# Patient Record
Sex: Male | Born: 1975 | Race: Black or African American | Hispanic: No | Marital: Single | Smoking: Current every day smoker
Health system: Southern US, Community
[De-identification: ages and names within clinical notes are randomized; demographics above are authoritative.]

## PROBLEM LIST (undated history)

## (undated) DIAGNOSIS — Z720 Tobacco use: Secondary | ICD-10-CM

## (undated) DIAGNOSIS — E669 Obesity, unspecified: Secondary | ICD-10-CM

## (undated) DIAGNOSIS — L309 Dermatitis, unspecified: Secondary | ICD-10-CM

## (undated) DIAGNOSIS — J309 Allergic rhinitis, unspecified: Secondary | ICD-10-CM

## (undated) DIAGNOSIS — I10 Essential (primary) hypertension: Secondary | ICD-10-CM

## (undated) DIAGNOSIS — E7800 Pure hypercholesterolemia, unspecified: Secondary | ICD-10-CM

---

## 2010-06-02 ENCOUNTER — Emergency Department (HOSPITAL_COMMUNITY): Admission: EM | Admit: 2010-06-02 | Discharge: 2010-06-02 | Payer: Self-pay | Admitting: Family Medicine

## 2010-06-22 ENCOUNTER — Emergency Department (HOSPITAL_COMMUNITY): Admission: EM | Admit: 2010-06-22 | Discharge: 2010-06-22 | Payer: Self-pay | Admitting: Emergency Medicine

## 2010-07-20 ENCOUNTER — Encounter: Admission: RE | Admit: 2010-07-20 | Discharge: 2010-09-17 | Payer: Self-pay | Admitting: General Practice

## 2010-08-12 ENCOUNTER — Emergency Department (HOSPITAL_COMMUNITY): Admission: EM | Admit: 2010-08-12 | Discharge: 2010-08-13 | Payer: Self-pay | Admitting: Emergency Medicine

## 2011-02-18 ENCOUNTER — Inpatient Hospital Stay (INDEPENDENT_AMBULATORY_CARE_PROVIDER_SITE_OTHER)
Admission: RE | Admit: 2011-02-18 | Discharge: 2011-02-18 | Disposition: A | Discharge status: Home / Self Care | Payer: Self-pay | Source: Ambulatory Visit | Attending: Family Medicine | Admitting: Family Medicine

## 2011-02-18 DIAGNOSIS — M5430 Sciatica, unspecified side: Secondary | ICD-10-CM

## 2011-03-05 LAB — DIFFERENTIAL
Basophils Absolute: 0.1 10*3/uL (ref 0.0–0.1)
Basophils Relative: 1 % (ref 0–1)
Lymphs Abs: 3.1 10*3/uL (ref 0.7–4.0)
Monocytes Relative: 8 % (ref 3–12)
Neutro Abs: 2.5 10*3/uL (ref 1.7–7.7)
Neutrophils Relative %: 39 % — ABNORMAL LOW (ref 43–77)

## 2011-03-05 LAB — BASIC METABOLIC PANEL
CO2: 29 mEq/L (ref 19–32)
Chloride: 105 mEq/L (ref 96–112)
GFR calc Af Amer: >60 mL/min (ref >60)
GFR calc non Af Amer: >60 mL/min (ref >60)
Glucose, Bld: 101 mg/dL — ABNORMAL HIGH (ref 70–99)
Potassium: 3.3 mEq/L — ABNORMAL LOW (ref 3.5–5.1)

## 2011-03-05 LAB — CBC
Hemoglobin: 14 g/dL (ref 13.0–17.0)
MCH: 26.9 pg (ref 26.0–34.0)
MCHC: 33.2 g/dL (ref 30.0–36.0)
RDW: 13 % (ref 11.5–15.5)
WBC: 6.6 10*3/uL (ref 4.0–10.5)

## 2011-03-05 LAB — POCT CARDIAC MARKERS: CKMB, poc: 1.1 ng/mL (ref 1.0–8.0)

## 2011-03-07 LAB — DIFFERENTIAL
Basophils Absolute: 0 10*3/uL (ref 0.0–0.1)
Eosinophils Absolute: 0.2 10*3/uL (ref 0.0–0.7)
Eosinophils Relative: 4 % (ref 0–5)
Lymphs Abs: 2.9 10*3/uL (ref 0.7–4.0)
Monocytes Relative: 10 % (ref 3–12)
Neutrophils Relative %: 34 % — ABNORMAL LOW (ref 43–77)

## 2011-03-07 LAB — CBC
Hemoglobin: 14.3 g/dL (ref 13.0–17.0)
MCH: 27.6 pg (ref 26.0–34.0)
MCV: 79.6 fL (ref 78.0–100.0)
Platelets: 215 10*3/uL (ref 150–400)
WBC: 5.5 10*3/uL (ref 4.0–10.5)

## 2011-03-07 LAB — URINALYSIS, ROUTINE W REFLEX MICROSCOPIC
Bilirubin Urine: NEGATIVE
Glucose, UA: >1000 mg/dL — AB
Leukocytes, UA: NEGATIVE
Urobilinogen, UA: 0.2 mg/dL (ref 0.0–1.0)

## 2011-03-07 LAB — COMPREHENSIVE METABOLIC PANEL
AST: 23 U/L (ref 0–37)
Albumin: 4 g/dL (ref 3.5–5.2)
Alkaline Phosphatase: 79 U/L (ref 39–117)
Creatinine, Ser: 1.03 mg/dL (ref 0.4–1.5)
GFR calc Af Amer: >60 mL/min (ref >60)
Glucose, Bld: 414 mg/dL — ABNORMAL HIGH (ref 70–99)
Total Bilirubin: 1.2 mg/dL (ref 0.3–1.2)

## 2011-03-07 LAB — HEMOGLOBIN A1C: Hgb A1c MFr Bld: 15 % — ABNORMAL HIGH (ref <5.7)

## 2011-03-07 LAB — GLUCOSE, CAPILLARY
Glucose-Capillary: 316 mg/dL — ABNORMAL HIGH (ref 70–99)
Glucose-Capillary: 428 mg/dL — ABNORMAL HIGH (ref 70–99)

## 2011-03-07 LAB — KETONES, QUALITATIVE

## 2011-03-07 LAB — URINE MICROSCOPIC-ADD ON

## 2011-08-26 ENCOUNTER — Emergency Department (HOSPITAL_COMMUNITY)
Admission: EM | Admit: 2011-08-26 | Discharge: 2011-08-26 | Disposition: A | Discharge status: Home / Self Care | Payer: No Typology Code available for payment source | Attending: Emergency Medicine | Admitting: Emergency Medicine

## 2011-08-26 DIAGNOSIS — IMO0002 Reserved for concepts with insufficient information to code with codable children: Secondary | ICD-10-CM | POA: Insufficient documentation

## 2011-08-26 DIAGNOSIS — X503XXA Overexertion from repetitive movements, initial encounter: Secondary | ICD-10-CM | POA: Insufficient documentation

## 2011-08-26 DIAGNOSIS — Z79899 Other long term (current) drug therapy: Secondary | ICD-10-CM | POA: Insufficient documentation

## 2011-08-26 DIAGNOSIS — J45909 Unspecified asthma, uncomplicated: Secondary | ICD-10-CM | POA: Insufficient documentation

## 2011-08-26 DIAGNOSIS — M549 Dorsalgia, unspecified: Secondary | ICD-10-CM | POA: Insufficient documentation

## 2011-08-26 DIAGNOSIS — I10 Essential (primary) hypertension: Secondary | ICD-10-CM | POA: Insufficient documentation

## 2011-08-26 DIAGNOSIS — E119 Type 2 diabetes mellitus without complications: Secondary | ICD-10-CM | POA: Insufficient documentation

## 2011-10-04 ENCOUNTER — Inpatient Hospital Stay (INDEPENDENT_AMBULATORY_CARE_PROVIDER_SITE_OTHER)
Admission: RE | Admit: 2011-10-04 | Discharge: 2011-10-04 | Disposition: A | Discharge status: Home / Self Care | Payer: No Typology Code available for payment source | Source: Ambulatory Visit | Attending: Family Medicine | Admitting: Family Medicine

## 2011-10-04 DIAGNOSIS — J069 Acute upper respiratory infection, unspecified: Secondary | ICD-10-CM

## 2011-10-04 DIAGNOSIS — J45909 Unspecified asthma, uncomplicated: Secondary | ICD-10-CM

## 2012-04-13 ENCOUNTER — Emergency Department (HOSPITAL_COMMUNITY)
Admission: EM | Admit: 2012-04-13 | Discharge: 2012-04-13 | Disposition: A | Discharge status: Home / Self Care | Payer: No Typology Code available for payment source | Attending: Emergency Medicine | Admitting: Emergency Medicine

## 2012-04-13 ENCOUNTER — Emergency Department (HOSPITAL_COMMUNITY): Payer: No Typology Code available for payment source

## 2012-04-13 ENCOUNTER — Encounter (HOSPITAL_COMMUNITY): Payer: Self-pay | Admitting: *Deleted

## 2012-04-13 DIAGNOSIS — E7800 Pure hypercholesterolemia, unspecified: Secondary | ICD-10-CM | POA: Insufficient documentation

## 2012-04-13 DIAGNOSIS — R079 Chest pain, unspecified: Secondary | ICD-10-CM | POA: Insufficient documentation

## 2012-04-13 DIAGNOSIS — J45909 Unspecified asthma, uncomplicated: Secondary | ICD-10-CM | POA: Insufficient documentation

## 2012-04-13 DIAGNOSIS — Z79899 Other long term (current) drug therapy: Secondary | ICD-10-CM | POA: Insufficient documentation

## 2012-04-13 DIAGNOSIS — J40 Bronchitis, not specified as acute or chronic: Secondary | ICD-10-CM | POA: Insufficient documentation

## 2012-04-13 DIAGNOSIS — I10 Essential (primary) hypertension: Secondary | ICD-10-CM | POA: Insufficient documentation

## 2012-04-13 DIAGNOSIS — E119 Type 2 diabetes mellitus without complications: Secondary | ICD-10-CM | POA: Insufficient documentation

## 2012-04-13 HISTORY — DX: Essential (primary) hypertension: I10

## 2012-04-13 HISTORY — DX: Pure hypercholesterolemia, unspecified: E78.00

## 2012-04-13 LAB — TROPONIN I
Troponin I: <0.3 ng/mL (ref <0.30)
Troponin I: <0.3 ng/mL (ref <0.30)

## 2012-04-13 LAB — BASIC METABOLIC PANEL
CO2: 26 mEq/L (ref 19–32)
Chloride: 102 mEq/L (ref 96–112)
Glucose, Bld: 174 mg/dL — ABNORMAL HIGH (ref 70–99)
Potassium: 3.2 mEq/L — ABNORMAL LOW (ref 3.5–5.1)
Sodium: 139 mEq/L (ref 135–145)

## 2012-04-13 LAB — CBC
MCV: 78.7 fL (ref 78.0–100.0)
Platelets: 262 10*3/uL (ref 150–400)
RBC: 5.58 MIL/uL (ref 4.22–5.81)
WBC: 4.8 10*3/uL (ref 4.0–10.5)

## 2012-04-13 LAB — URINALYSIS, ROUTINE W REFLEX MICROSCOPIC
Glucose, UA: 100 mg/dL — AB
Hgb urine dipstick: NEGATIVE
Leukocytes, UA: NEGATIVE
Specific Gravity, Urine: 1.026 (ref 1.005–1.030)
Urobilinogen, UA: 0.2 mg/dL (ref 0.0–1.0)

## 2012-04-13 LAB — DIFFERENTIAL
Lymphocytes Relative: 43 % (ref 12–46)
Lymphs Abs: 2.1 10*3/uL (ref 0.7–4.0)
Neutro Abs: 2.1 10*3/uL (ref 1.7–7.7)
Neutrophils Relative %: 44 % (ref 43–77)

## 2012-04-13 MED ORDER — GI COCKTAIL ~~LOC~~
30.0000 mL | Freq: Once | ORAL | Status: AC
  Administered 2012-04-13: 30 mL via ORAL
  Filled 2012-04-13: qty 30

## 2012-04-13 MED ORDER — ALBUTEROL SULFATE (5 MG/ML) 0.5% IN NEBU
5.0000 mg | INHALATION_SOLUTION | Freq: Once | RESPIRATORY_TRACT | Status: AC
  Administered 2012-04-13: 5 mg via RESPIRATORY_TRACT
  Filled 2012-04-13: qty 1

## 2012-04-13 MED ORDER — ASPIRIN 81 MG PO CHEW
324.0000 mg | CHEWABLE_TABLET | Freq: Once | ORAL | Status: AC
  Administered 2012-04-13: 324 mg via ORAL
  Filled 2012-04-13: qty 4

## 2012-04-13 MED ORDER — POTASSIUM CHLORIDE CRYS ER 20 MEQ PO TBCR
20.0000 meq | EXTENDED_RELEASE_TABLET | Freq: Once | ORAL | Status: AC
  Administered 2012-04-13: 20 meq via ORAL
  Filled 2012-04-13: qty 1

## 2012-04-13 MED ORDER — ALBUTEROL SULFATE HFA 108 (90 BASE) MCG/ACT IN AERS
2.0000 | INHALATION_SPRAY | RESPIRATORY_TRACT | Status: DC | PRN
  Filled 2012-04-13: qty 6.7

## 2012-04-13 MED ORDER — MORPHINE SULFATE 4 MG/ML IJ SOLN
4.0000 mg | Freq: Once | INTRAMUSCULAR | Status: AC
  Administered 2012-04-13: 4 mg via INTRAVENOUS
  Filled 2012-04-13: qty 1

## 2012-04-13 MED ORDER — SODIUM CHLORIDE 0.9 % IV BOLUS (SEPSIS)
1000.0000 mL | Freq: Once | INTRAVENOUS | Status: AC
  Administered 2012-04-13: 1000 mL via INTRAVENOUS

## 2012-04-13 NOTE — Discharge Instructions (Signed)
Bronchitis Bronchitis is the body's way of reacting to injury and/or infection (inflammation) of the bronchi. Bronchi are the air tubes that extend from the windpipe into the lungs. If the inflammation becomes severe, it may cause shortness of breath. CAUSES  Inflammation may be caused by:  A virus.   Germs (bacteria).   Dust.   Allergens.   Pollutants and many other irritants.  The cells lining the bronchial tree are covered with tiny hairs (cilia). These constantly beat upward, away from the lungs, toward the mouth. This keeps the lungs free of pollutants. When these cells become too irritated and are unable to do their job, mucus begins to develop. This causes the characteristic cough of bronchitis. The cough clears the lungs when the cilia are unable to do their job. Without either of these protective mechanisms, the mucus would settle in the lungs. Then you would develop pneumonia. Smoking is a common cause of bronchitis and can contribute to pneumonia. Stopping this habit is the single most important thing you can do to help yourself. TREATMENT   Your caregiver may prescribe an antibiotic if the cough is caused by bacteria. Also, medicines that open up your airways make it easier to breathe. Your caregiver may also recommend or prescribe an expectorant. It will loosen the mucus to be coughed up. Only take over-the-counter or prescription medicines for pain, discomfort, or fever as directed by your caregiver.   Removing whatever causes the problem (smoking, for example) is critical to preventing the problem from getting worse.   Cough suppressants may be prescribed for relief of cough symptoms.   Inhaled medicines may be prescribed to help with symptoms now and to help prevent problems from returning.   For those with recurrent (chronic) bronchitis, there may be a need for steroid medicines.  SEEK IMMEDIATE MEDICAL CARE IF:   During treatment, you develop more pus-like mucus  (purulent sputum).   You have a fever.   Your baby is older than 3 months with a rectal temperature of 102 F (38.9 C) or higher.   Your baby is 3 months old or younger with a rectal temperature of 100.4 F (38 C) or higher.   You become progressively more ill.   You have increased difficulty breathing, wheezing, or shortness of breath.  It is necessary to seek immediate medical care if you are elderly or sick from any other disease. MAKE SURE YOU:   Understand these instructions.   Will watch your condition.   Will get help right away if you are not doing well or get worse.  Document Released: 12/06/2005 Document Revised: 11/25/2011 Document Reviewed: 10/15/2008 Exit[employer] Patient Information 2012 [employer].Chest Pain (Nonspecific) It is often hard to give a specific diagnosis for the cause of chest pain. There is always a chance that your pain could be related to something serious, such as a heart attack or a blood clot in the lungs. You need to follow up with your caregiver for further evaluation. CAUSES   Heartburn.   Pneumonia or bronchitis.   Anxiety or stress.   Inflammation around your heart (pericarditis) or lung (pleuritis or pleurisy).   A blood clot in the lung.   A collapsed lung (pneumothorax). It can develop suddenly on its own (spontaneous pneumothorax) or from injury (trauma) to the chest.   Shingles infection (herpes zoster virus).  The chest wall is composed of bones, muscles, and cartilage. Any of these can be the source of the pain.  The bones can   be bruised by injury.   The muscles or cartilage can be strained by coughing or overwork.   The cartilage can be affected by inflammation and become sore (costochondritis).  DIAGNOSIS  Lab tests or other studies, such as X-rays, electrocardiography, stress testing, or cardiac imaging, may be needed to find the cause of your pain.  TREATMENT   Treatment depends on what may be causing your chest pain.  Treatment may include:   Acid blockers for heartburn.   Anti-inflammatory medicine.   Pain medicine for inflammatory conditions.   Antibiotics if an infection is present.   You may be advised to change lifestyle habits. This includes stopping smoking and avoiding alcohol, caffeine, and chocolate.   You may be advised to keep your head raised (elevated) when sleeping. This reduces the chance of acid going backward from your stomach into your esophagus.   Most of the time, nonspecific chest pain will improve within 2 to 3 days with rest and mild pain medicine.  HOME CARE INSTRUCTIONS   If antibiotics were prescribed, take your antibiotics as directed. Finish them even if you start to feel better.   For the next few days, avoid physical activities that bring on chest pain. Continue physical activities as directed.   Do not smoke.   Avoid drinking alcohol.   Only take over-the-counter or prescription medicine for pain, discomfort, or fever as directed by your caregiver.   Follow your caregiver's suggestions for further testing if your chest pain does not go away.   Keep any follow-up appointments you made. If you do not go to an appointment, you could develop lasting (chronic) problems with pain. If there is any problem keeping an appointment, you must call to reschedule.  SEEK MEDICAL CARE IF:   You think you are having problems from the medicine you are taking. Read your medicine instructions carefully.   Your chest pain does not go away, even after treatment.   You develop a rash with blisters on your chest.  SEEK IMMEDIATE MEDICAL CARE IF:   You have increased chest pain or pain that spreads to your arm, neck, jaw, back, or abdomen.   You develop shortness of breath, an increasing cough, or you are coughing up blood.   You have severe back or abdominal pain, feel nauseous, or vomit.   You develop severe weakness, fainting, or chills.   You have a fever.  THIS IS AN  EMERGENCY. Do not wait to see if the pain will go away. Get medical help at once. Call your local emergency services (911 in [place] Do not drive yourself to the hospital. MAKE SURE YOU:   Understand these instructions.   Will watch your condition.   Will get help right away if you are not doing well or get worse.  Document Released: 09/15/2005 Document Revised: 11/25/2011 Document Reviewed: 07/11/2008 Exit[employer] Patient Information 2012 [employer]. 

## 2012-04-13 NOTE — ED Notes (Signed)
Pt states "I go to the [facility] in [place], but I live here, gotta find me a doctor here 'cause she just up and left, my chest started hurting yesterday, feels like something sitting on me & tightness"

## 2012-04-13 NOTE — ED Provider Notes (Signed)
History     CSN: [id number]  Arrival date & time 04/13/12  0805   First MD Initiated Contact with Patient 04/13/12 0825      Chief Complaint  Patient presents with  . Chest Pain    (Consider location/radiation/quality/duration/timing/severity/associated sxs/prior treatment) HPI Comments: Pt also states that he feels very tired this am and that he almost fell asleep while driving  Patient is a 36 y.o. male presenting with chest pain. The history is provided by the patient. No language interpreter was used.  Chest Pain The chest pain began 6 - 12 hours ago. Chest pain occurs constantly. The chest pain is unchanged. The severity of the pain is moderate. The quality of the pain is described as pressure-like. The pain does not radiate. Primary symptoms include shortness of breath. Pertinent negatives for primary symptoms include no fever, no fatigue, no syncope, no cough, no palpitations, no abdominal pain, no nausea, no vomiting and no dizziness.  Pertinent negatives for associated symptoms include no claudication, no diaphoresis, no numbness, no orthopnea, no paroxysmal nocturnal dyspnea and no weakness. He tried nothing for the symptoms.  His past medical history is significant for diabetes, hyperlipidemia and hypertension.     Past Medical History  Diagnosis Date  . Diabetes mellitus   . Hypertension   . Hypercholesteremia   . Asthma     History reviewed. No pertinent past surgical history.  No family history on file.  History  Substance Use Topics  . Smoking status: Current Everyday Smoker -- 0.0 packs/day  . Smokeless tobacco: Not on file  . Alcohol Use: No      Review of Systems  Constitutional: Negative for fever, diaphoresis, activity change, appetite change and fatigue.  HENT: Negative for congestion, sore throat, rhinorrhea, neck pain and neck stiffness.   Respiratory: Positive for shortness of breath. Negative for cough.   Cardiovascular: Positive for chest  pain. Negative for palpitations, orthopnea, claudication and syncope.  Gastrointestinal: Negative for nausea, vomiting, abdominal pain, diarrhea and constipation.  Genitourinary: Negative for dysuria, urgency, frequency and flank pain.  Musculoskeletal: Negative for myalgias, back pain and arthralgias.  Neurological: Negative for dizziness, weakness, light-headedness, numbness and headaches.  All other systems reviewed and are negative.    Allergies  Shellfish allergy and Iodine  Home Medications   Current Outpatient Rx  Name Route Sig Dispense Refill  . ALBUTEROL SULFATE HFA 108 (90 BASE) MCG/ACT IN AERS Inhalation Inhale 2 puffs into the lungs every 6 (six) hours as needed. For shortness of breath and wheezing    . GLIPIZIDE ER 5 MG PO TB24 Oral Take 5 mg by mouth daily.    Marland Kitchen HYDROCHLOROTHIAZIDE 25 MG PO TABS Oral Take 25 mg by mouth daily.    Marland Kitchen LISINOPRIL 10 MG PO TABS Oral Take 10 mg by mouth daily.    Marland Kitchen METFORMIN HCL ER 500 MG PO TB24 Oral Take 500 mg by mouth 2 (two) times daily.    Marland Kitchen OXYMETAZOLINE HCL 0.05 % NA SOLN Nasal Place 2 sprays into the nose 2 (two) times daily.    Marland Kitchen PRAVASTATIN SODIUM 20 MG PO TABS Oral Take 20 mg by mouth daily.      BP 109/56  Pulse 58  Temp(Src) 97.4 F (36.3 C) (Oral)  Resp 16  Wt 207 lb (93.895 kg)  SpO2 99%  Physical Exam  Nursing note and vitals reviewed. Constitutional: He is oriented to person, place, and time. He appears well-developed and well-nourished. No distress.  HENT:  Head: Normocephalic and atraumatic.  Mouth/Throat: Oropharynx is clear and moist. No oropharyngeal exudate.  Eyes: Conjunctivae and EOM are normal. Pupils are equal, round, and reactive to light.  Neck: Normal range of motion. Neck supple.  Cardiovascular: Normal rate, regular rhythm, normal heart sounds and intact distal pulses.  Exam reveals no gallop and no friction rub.   No murmur heard. Pulmonary/Chest: Effort normal and breath sounds normal. No  respiratory distress. He exhibits no tenderness.  Abdominal: Soft. Bowel sounds are normal. There is no tenderness. There is no rebound and no guarding.  Musculoskeletal: Normal range of motion. He exhibits no tenderness.  Neurological: He is alert and oriented to person, place, and time. No cranial nerve deficit.  Skin: Skin is warm and dry. No rash noted.    ED Course  Procedures (including critical care time)   Date: 04/13/2012  Rate: 74  Rhythm: normal sinus rhythm  QRS Axis: normal  Intervals: normal  ST/T Wave abnormalities: nonspecific T wave changes  Conduction Disutrbances:none  Narrative Interpretation:   Old EKG Reviewed: none available  Labs Reviewed  GLUCOSE, CAPILLARY - Abnormal; Notable for the following:    Glucose-Capillary 228 (*)    All other components within normal limits  DIFFERENTIAL - Abnormal; Notable for the following:    Eosinophils Relative 6 (*)    All other components within normal limits  BASIC METABOLIC PANEL - Abnormal; Notable for the following:    Potassium 3.2 (*)    Glucose, Bld 174 (*)    All other components within normal limits  URINALYSIS, ROUTINE W REFLEX MICROSCOPIC - Abnormal; Notable for the following:    Glucose, UA 100 (*)    All other components within normal limits  CBC  TROPONIN I  TROPONIN I   Dg Chest 2 View  04/13/2012  *RADIOLOGY REPORT*  Clinical Data: Shortness of breath.  Smoker.  CHEST - 2 VIEW  Comparison: PA and lateral chest 08/12/2010.  Findings: Lungs are clear. Peribronchial thickening is seen.  Heart size is normal.  No pneumothorax or effusion.  No focal bony abnormality.  IMPRESSION: No acute disease.  Bronchitic change noted.  Original Report Authenticated By: Bernadene Bell. D'ALESSIO, M.D.     1. Chest pain   2. Bronchitis       MDM  Chest pain with low concern for ACS as a cause of the symptoms. Patient's had persistent pain and had a negative delta troponin. I feel this adequately rules out ACS in this  scenario. He has good primary care followup. Blood sugars within an acceptable range. Has been taking his medications as directed. Has a component of bronchitis on chest x-ray. Received albuterol with improvement of his overall condition. He'll be discharged home with instructions to followup with his primary care physician. I do feel the patient has a component of sleep apnea. Experienced him snoring while in the room and he has been falling asleep easily. I instructed him to discuss formal sleep testing with his primary care physician        Dayton Bailiff, MD 04/13/12 1222

## 2012-11-22 ENCOUNTER — Observation Stay (HOSPITAL_COMMUNITY)
Admission: EM | Admit: 2012-11-22 | Discharge: 2012-11-23 | Disposition: A | Discharge status: Home / Self Care | Payer: No Typology Code available for payment source | Attending: Internal Medicine | Admitting: Internal Medicine

## 2012-11-22 ENCOUNTER — Encounter (HOSPITAL_COMMUNITY): Payer: Self-pay | Admitting: *Deleted

## 2012-11-22 ENCOUNTER — Emergency Department (HOSPITAL_COMMUNITY): Payer: No Typology Code available for payment source

## 2012-11-22 DIAGNOSIS — J309 Allergic rhinitis, unspecified: Secondary | ICD-10-CM

## 2012-11-22 DIAGNOSIS — E7800 Pure hypercholesterolemia, unspecified: Secondary | ICD-10-CM

## 2012-11-22 DIAGNOSIS — R51 Headache: Secondary | ICD-10-CM | POA: Insufficient documentation

## 2012-11-22 DIAGNOSIS — F17200 Nicotine dependence, unspecified, uncomplicated: Secondary | ICD-10-CM | POA: Insufficient documentation

## 2012-11-22 DIAGNOSIS — I10 Essential (primary) hypertension: Secondary | ICD-10-CM | POA: Insufficient documentation

## 2012-11-22 DIAGNOSIS — J3489 Other specified disorders of nose and nasal sinuses: Secondary | ICD-10-CM | POA: Insufficient documentation

## 2012-11-22 DIAGNOSIS — M542 Cervicalgia: Secondary | ICD-10-CM | POA: Insufficient documentation

## 2012-11-22 DIAGNOSIS — E669 Obesity, unspecified: Secondary | ICD-10-CM | POA: Insufficient documentation

## 2012-11-22 DIAGNOSIS — E876 Hypokalemia: Secondary | ICD-10-CM | POA: Insufficient documentation

## 2012-11-22 DIAGNOSIS — J45909 Unspecified asthma, uncomplicated: Secondary | ICD-10-CM | POA: Insufficient documentation

## 2012-11-22 DIAGNOSIS — E119 Type 2 diabetes mellitus without complications: Secondary | ICD-10-CM | POA: Insufficient documentation

## 2012-11-22 DIAGNOSIS — M79609 Pain in unspecified limb: Secondary | ICD-10-CM | POA: Insufficient documentation

## 2012-11-22 DIAGNOSIS — R079 Chest pain, unspecified: Secondary | ICD-10-CM

## 2012-11-22 DIAGNOSIS — I209 Angina pectoris, unspecified: Secondary | ICD-10-CM

## 2012-11-22 DIAGNOSIS — Z79899 Other long term (current) drug therapy: Secondary | ICD-10-CM | POA: Insufficient documentation

## 2012-11-22 DIAGNOSIS — Z720 Tobacco use: Secondary | ICD-10-CM | POA: Diagnosis present

## 2012-11-22 DIAGNOSIS — L309 Dermatitis, unspecified: Secondary | ICD-10-CM

## 2012-11-22 DIAGNOSIS — R0602 Shortness of breath: Secondary | ICD-10-CM | POA: Insufficient documentation

## 2012-11-22 DIAGNOSIS — R0789 Other chest pain: Principal | ICD-10-CM | POA: Insufficient documentation

## 2012-11-22 DIAGNOSIS — E1169 Type 2 diabetes mellitus with other specified complication: Secondary | ICD-10-CM

## 2012-11-22 HISTORY — DX: Tobacco use: Z72.0

## 2012-11-22 HISTORY — DX: Allergic rhinitis, unspecified: J30.9

## 2012-11-22 HISTORY — DX: Obesity, unspecified: E66.9

## 2012-11-22 HISTORY — DX: Dermatitis, unspecified: L30.9

## 2012-11-22 LAB — BASIC METABOLIC PANEL
CO2: 28 mEq/L (ref 19–32)
Calcium: 9.2 mg/dL (ref 8.4–10.5)
Creatinine, Ser: 1.03 mg/dL (ref 0.50–1.35)
GFR calc Af Amer: >90 mL/min (ref >90)
GFR calc non Af Amer: >90 mL/min (ref >90)
Sodium: 138 mEq/L (ref 135–145)

## 2012-11-22 LAB — CBC
HCT: 42.1 % (ref 39.0–52.0)
Hemoglobin: 14 g/dL (ref 13.0–17.0)
MCH: 26.2 pg (ref 26.0–34.0)
MCHC: 33.3 g/dL (ref 30.0–36.0)
RDW: 13 % (ref 11.5–15.5)

## 2012-11-22 LAB — TSH: TSH: 0.79 u[IU]/mL (ref 0.350–4.500)

## 2012-11-22 LAB — RAPID URINE DRUG SCREEN, HOSP PERFORMED: Opiates: NOT DETECTED

## 2012-11-22 LAB — CBC WITH DIFFERENTIAL/PLATELET
Basophils Absolute: 0 10*3/uL (ref 0.0–0.1)
Basophils Relative: 1 % (ref 0–1)
Eosinophils Absolute: 0.2 10*3/uL (ref 0.0–0.7)
Eosinophils Relative: 4 % (ref 0–5)
Lymphocytes Relative: 46 % (ref 12–46)
MCHC: 33.4 g/dL (ref 30.0–36.0)
MCV: 78.1 fL (ref 78.0–100.0)
Platelets: 251 10*3/uL (ref 150–400)
RDW: 12.9 % (ref 11.5–15.5)
WBC: 5.3 10*3/uL (ref 4.0–10.5)

## 2012-11-22 LAB — URINALYSIS, ROUTINE W REFLEX MICROSCOPIC
Hgb urine dipstick: NEGATIVE
Ketones, ur: NEGATIVE mg/dL
Protein, ur: NEGATIVE mg/dL
Urobilinogen, UA: 1 mg/dL (ref 0.0–1.0)

## 2012-11-22 LAB — PROTIME-INR: INR: 0.92 (ref 0.00–1.49)

## 2012-11-22 LAB — TROPONIN I
Troponin I: <0.3 ng/mL (ref <0.30)
Troponin I: <0.3 ng/mL (ref <0.30)
Troponin I: <0.3 ng/mL (ref <0.30)

## 2012-11-22 LAB — APTT: aPTT: 39 seconds — ABNORMAL HIGH (ref 24–37)

## 2012-11-22 MED ORDER — NITROGLYCERIN 0.4 MG SL SUBL
0.4000 mg | SUBLINGUAL_TABLET | SUBLINGUAL | Status: DC | PRN
  Administered 2012-11-22 (×3): 0.4 mg via SUBLINGUAL

## 2012-11-22 MED ORDER — ACETAMINOPHEN 325 MG PO TABS: 650.0000 mg | ORAL_TABLET | ORAL | Status: DC | PRN

## 2012-11-22 MED ORDER — METOPROLOL TARTRATE 12.5 MG HALF TABLET
12.5000 mg | ORAL_TABLET | Freq: Two times a day (BID) | ORAL | Status: DC
  Administered 2012-11-22 – 2012-11-23 (×2): 12.5 mg via ORAL
  Filled 2012-11-22 (×3): qty 1

## 2012-11-22 MED ORDER — HEPARIN (PORCINE) IN NACL 100-0.45 UNIT/ML-% IJ SOLN: 1200.0000 [IU]/h | INTRAMUSCULAR | Status: DC

## 2012-11-22 MED ORDER — HYDROMORPHONE HCL PF 1 MG/ML IJ SOLN
1.0000 mg | INTRAMUSCULAR | Status: AC | PRN
  Administered 2012-11-22: 1 mg via INTRAVENOUS
  Filled 2012-11-22: qty 1

## 2012-11-22 MED ORDER — ASPIRIN EC 81 MG PO TBEC
81.0000 mg | DELAYED_RELEASE_TABLET | Freq: Every day | ORAL | Status: DC
  Administered 2012-11-23: 81 mg via ORAL
  Filled 2012-11-22: qty 1

## 2012-11-22 MED ORDER — GLIPIZIDE ER 5 MG PO TB24
5.0000 mg | ORAL_TABLET | Freq: Every day | ORAL | Status: DC
  Administered 2012-11-23: 5 mg via ORAL
  Filled 2012-11-22: qty 1

## 2012-11-22 MED ORDER — ASPIRIN 81 MG PO CHEW
162.0000 mg | CHEWABLE_TABLET | Freq: Once | ORAL | Status: AC
  Administered 2012-11-22: 162 mg via ORAL
  Filled 2012-11-22: qty 2

## 2012-11-22 MED ORDER — POTASSIUM CHLORIDE CRYS ER 20 MEQ PO TBCR
40.0000 meq | EXTENDED_RELEASE_TABLET | Freq: Once | ORAL | Status: AC
  Administered 2012-11-22: 40 meq via ORAL
  Filled 2012-11-22: qty 2

## 2012-11-22 MED ORDER — ACETAMINOPHEN 325 MG PO TABS: 650.0000 mg | ORAL_TABLET | Freq: Four times a day (QID) | ORAL | Status: DC | PRN

## 2012-11-22 MED ORDER — LISINOPRIL 10 MG PO TABS
10.0000 mg | ORAL_TABLET | Freq: Every day | ORAL | Status: DC
  Administered 2012-11-22: 10 mg via ORAL
  Filled 2012-11-22 (×2): qty 1

## 2012-11-22 MED ORDER — METFORMIN HCL ER 500 MG PO TB24
500.0000 mg | ORAL_TABLET | Freq: Two times a day (BID) | ORAL | Status: DC
  Administered 2012-11-22 – 2012-11-23 (×2): 500 mg via ORAL
  Filled 2012-11-22 (×3): qty 1

## 2012-11-22 MED ORDER — ONDANSETRON HCL 4 MG/2ML IJ SOLN: 4.0000 mg | Freq: Four times a day (QID) | INTRAMUSCULAR | Status: DC | PRN

## 2012-11-22 MED ORDER — ONDANSETRON HCL 4 MG/2ML IJ SOLN: 4.0000 mg | Freq: Three times a day (TID) | INTRAMUSCULAR | Status: AC | PRN

## 2012-11-22 MED ORDER — ASPIRIN 81 MG PO CHEW
324.0000 mg | CHEWABLE_TABLET | ORAL | Status: AC
  Administered 2012-11-22: 324 mg via ORAL
  Filled 2012-11-22: qty 4

## 2012-11-22 MED ORDER — ASPIRIN 300 MG RE SUPP
300.0000 mg | RECTAL | Status: AC
  Filled 2012-11-22: qty 1

## 2012-11-22 MED ORDER — SIMVASTATIN 10 MG PO TABS
10.0000 mg | ORAL_TABLET | Freq: Every day | ORAL | Status: DC
  Administered 2012-11-22: 10 mg via ORAL
  Filled 2012-11-22 (×2): qty 1

## 2012-11-22 MED ORDER — HEPARIN BOLUS VIA INFUSION: 4000.0000 [IU] | Freq: Once | INTRAVENOUS | Status: DC

## 2012-11-22 MED ORDER — ALBUTEROL SULFATE HFA 108 (90 BASE) MCG/ACT IN AERS
2.0000 | INHALATION_SPRAY | Freq: Four times a day (QID) | RESPIRATORY_TRACT | Status: DC | PRN
  Filled 2012-11-22 (×2): qty 6.7

## 2012-11-22 MED ORDER — NITROGLYCERIN 0.4 MG SL SUBL: 0.4000 mg | SUBLINGUAL_TABLET | SUBLINGUAL | Status: DC | PRN

## 2012-11-22 MED ORDER — ENOXAPARIN SODIUM 40 MG/0.4ML ~~LOC~~ SOLN
40.0000 mg | SUBCUTANEOUS | Status: DC
  Administered 2012-11-22: 40 mg via SUBCUTANEOUS
  Filled 2012-11-22 (×2): qty 0.4

## 2012-11-22 MED ORDER — HYDROCHLOROTHIAZIDE 25 MG PO TABS
25.0000 mg | ORAL_TABLET | Freq: Every day | ORAL | Status: DC
  Administered 2012-11-23: 25 mg via ORAL
  Filled 2012-11-22: qty 1

## 2012-11-22 NOTE — Progress Notes (Signed)
ANTICOAGULATION CONSULT NOTE - Initial Consult  Pharmacy Consult for Heparin Indication: chest pain/ACS  Allergies  Allergen Reactions  . Shellfish Allergy Anaphylaxis, Hives and Swelling  . Iodine Other (See Comments)    Patient Measurements: Ht: 69in Wt: 104kg IBW: 70.7kg Heparin Dosing Weight: 84kg  Vital Signs: Temp: 98.3 F (36.8 C) (12/04 1447) Temp src: Oral (12/04 1447) BP: 137/60 mmHg (12/04 1514) Pulse Rate: 76  (12/04 1514)  Labs:  [facility] 11/22/12 1331  HGB 13.7  HCT 41.0  PLT 251  APTT --  LABPROT --  INR --  HEPARINUNFRC --  CREATININE 1.03  CKTOTAL --  CKMB --  TROPONINI <0.30  PTT: PT/INR:  CrCl ~100 ml/min/1.72m2 (normalized)  Medical History: Past Medical History  Diagnosis Date  . Diabetes mellitus   . Hypertension   . Hypercholesteremia   . Asthma     Medications:  Scheduled:    . [COMPLETED] aspirin  162 mg Oral Once   Infusions:   PRN: nitroGLYCERIN  Assessment:  36 y.o. male presenting with chest pain and shortness of breath  Begin heparin per Rx for rule out MI, initial troponin negative, EKG with borderline T wave abnormalities  CBC essentially normal, no prior issues with bleeding reported  No anticoagulant/antiplatelet use prior to admit recorded; given one-time dose of aspirin 162mg  in ER  Goal of Therapy:  Heparin level 0.3-0.7 units/ml Monitor platelets by anticoagulation protocol: Yes   Plan:   Heparin 4000 units IV bolus x1  Heparin 1200 units/hr (~14 units/kg/hr of adjusted weight)  Check heparin level in 6hrs  Daily heparin level and CBC  Loralee Pacas, PharmD, BCPS Pager: [phone] 11/22/2012,4:39 PM

## 2012-11-22 NOTE — ED Notes (Signed)
Pt states started having chest pain yesterday but got worse today, states having L sided chest pain 8/10, shortness of breath, pain radiates down left leg, pt also states sometimes he has numbness/tingling in L arm and leg. Pt states chest pain feels like pressure. Denies n/v/d. Pt states "I do have a slight headache". Pt sleeping when entered the room.

## 2012-11-22 NOTE — H&P (Signed)
[facility] History and Physical  Julies Carmickle ZOX:[id number] DOB: [birth year] DOA: 11/22/2012  Referring physician:  Derwood Kaplan PCP: No primary provider on file.   Chief Complaint: chest pain  HPI:   The patient is a 36 yo male with history of HTN (7 years), HLD (2 years), T2DM (2 years), tobacco use.  Developed 8/10 substernal to left chest tightness/pressure/pain that radiated to the left neck.  It was associated with a slight headache, no increased SOB.  Denies associated nausea, diaphoresis.  He was able to sleep and felt better.  This morning, he awoke without pain but as he became more active, and the pain returned.  He again did not have SOB, nausea, diaphoresis with the pain, but he did feel lightheaded.  He went back to sleep but pain persisted.  Pain is worse with movement of the left shoulder and arm.  In the ER, he received NTG which did not help his pain initially, but an hour after his last dose, his pain improved from an 8/10 to a 7/10.    Review of Systems:   Denies fevers, chills, weight loss, night sweats, changes to hearing or vision.  Rhinorrhea and sinus congestion - getting over a cold.  He was previously coughing up yellow sputum which is getting better.  Sore throat is also resolving.  He states he can walk miles without shortness of breath.  Denies LEE, orthopnea.  PND twice weekly, stable.  Denies polyuria, polydipsia, dysuria, blood in stools, constipation, diarrhea, lymphadenopathy, skin rashes, ulcers, slurred speech, denies focal numbness/weakness, anxiety, depression, acid reflux.  Later this morning, he developed a pain that shot down the back of his leg to his heel and then resolved after a couple of minutes.    Past Medical History  Diagnosis Date  . Diabetes mellitus   . Hypertension   . Hypercholesteremia   . Asthma   . Eczema   . Allergic rhinitis   . Tobacco use   . Obesity    History reviewed. No pertinent past surgical history. Social  History:  reports that he has been smoking Cigarettes.  He has been smoking about .5 packs per day. He has never used smokeless tobacco. He reports that he does not drink alcohol or use illicit drugs. Lives in a house with girlfriend, son, and daughter.  Uses no assist device.  He works as a Financial risk analyst.    Allergies  Allergen Reactions  . Shellfish Allergy Anaphylaxis, Hives and Swelling  . Iodine Other (See Comments)    Family History  Problem Relation Age of Onset  . Bronchitis Mother   . Diabetes Father     borderline  . High blood pressure Brother   . Diabetes Brother     Prior to Admission medications   Medication Sig Start Date End Date Taking? Authorizing Provider  albuterol (PROVENTIL HFA;VENTOLIN HFA) 108 (90 BASE) MCG/ACT inhaler Inhale 2 puffs into the lungs every 6 (six) hours as needed. For shortness of breath and wheezing   Yes Historical Provider, MD  glipiZIDE (GLUCOTROL XL) 5 MG 24 hr tablet Take 5 mg by mouth daily.   Yes Historical Provider, MD  hydrochlorothiazide (HYDRODIURIL) 25 MG tablet Take 25 mg by mouth daily.   Yes Historical Provider, MD  lisinopril (PRINIVIL,ZESTRIL) 10 MG tablet Take 10 mg by mouth at bedtime.    Yes Historical Provider, MD  metFORMIN (GLUCOPHAGE-XR) 500 MG 24 hr tablet Take 500 mg by mouth 2 (two) times daily.   Yes Historical  Provider, MD  pravastatin (PRAVACHOL) 20 MG tablet Take 20 mg by mouth at bedtime.    Yes Historical Provider, MD   Physical Exam: Filed Vitals:   11/22/12 1500 11/22/12 1509 11/22/12 1514 11/22/12 1750  BP: 128/72 144/67 137/60 144/86  Pulse: 69 69 76 66  Temp:    98.1 F (36.7 C)  TempSrc:    Oral  Resp: 19 17 18 18   Height:    5\' 9"  (1.753 m)  Weight:    104.327 kg (230 lb)  SpO2: 99% 95% 98% 98%     General:  AAM, no acute distress, sitting in bed  Eyes: PERRL, anicteric, noninjected  ENT: NCAT, nares and OP nonerythematous, no exudate or plaques, MMM  Neck: supple, no thyromegaly  Lymph:  No  cervical or supraclavicular LAD  Cardiovascular: RRR, no mrg, 2+ pulses, normal S1, S2  Respiratory: CTAB  Abdomen: NABS, soft, nondistended, nontender, no organomegaly  Skin: warm and moist, no rash  Musculoskeletal: normal tone and bulk, FROM.  Point tenderness along the lateral left sternum that reproduces symptoms.  Psychiatric: A&Ox4, normal affect  Neurologic: III-XII grossly intact,   Labs on Admission:  Basic Metabolic Panel:  Lab 11/22/12 1610 11/22/12 1331  NA -- 138  K -- 3.3*  CL -- 99  CO2 -- 28  GLUCOSE -- 118*  BUN -- 16  CREATININE 1.14 1.03  CALCIUM -- 9.2  MG -- --  PHOS -- --   Liver Function Tests: No results found for this basename: AST:5,ALT:5,ALKPHOS:5,BILITOT:5,PROT:5,ALBUMIN:5 in the last 168 hours No results found for this basename: LIPASE:5,AMYLASE:5 in the last 168 hours No results found for this basename: AMMONIA:5 in the last 168 hours CBC:  Lab 11/22/12 1839 11/22/12 1331  WBC 7.2 5.3  NEUTROABS -- 2.1  HGB 14.0 13.7  HCT 42.1 41.0  MCV 78.7 78.1  PLT 279 251   Cardiac Enzymes:  Lab 11/22/12 1839 11/22/12 1716 11/22/12 1331  CKTOTAL -- -- --  CKMB -- -- --  CKMBINDEX -- -- --  TROPONINI <0.30 <0.30 <0.30    BNP (last 3 results) No results found for this basename: PROBNP:3 in the last 8760 hours CBG: No results found for this basename: GLUCAP:5 in the last 168 hours  Radiological Exams on Admission: Dg Chest 2 View  11/22/2012  *RADIOLOGY REPORT*  Clinical Data: Chest pain and shortness of breath.  CHEST - 2 VIEW  Comparison: 04/13/2012.  Findings: The cardiac silhouette, mediastinal and hilar contours are normal and stable.  The lungs are clear.  No pleural effusion. The bony thorax is intact.  IMPRESSION: Normal chest.  No change since prior study.   Original Report Authenticated By: Rudie Meyer, M.D.     EKG: Independently reviewed. Stable inverted T-waves in the inferior leads and V5-V6.  No ST-segment elevation or  depression  Assessment/Plan Principal Problem:  *Chest pain Active Problems:  Hypertension  Hypercholesteremia  Diabetes mellitus type 2 in obese  Obesity  Tobacco use  Chest pain:  Differential includes ACS, costochondritis, muscle strain.  Symptoms less likely related to acid reflux as reproducible with arm movement. -  Obs in Telemetry -  Cycle troponins -  A1c -  Lipid panel -  Aspirin daily -  Continue statin and ACEI -  Add low-dose beta blocker -  Contacted [facility] regarding scheduling outpatient stress test.    HTN:  Elevated BP.  Continue HCTZ, lisinopril, and add metoprolol HLD:  Lipid panel pending.  Continue statin T2DM:  Stable.  A1c pending.  Will continue home medications as will likely DC tomorrow AM.  Patient eating Asthma:  Stable. Continue albuterol prn Hypokalemia:  Likely related to diuretic.  Oral potassium  Tobacco use:  Counseling provided Obesity:  Counseled to lose weight  DIET:  Diabetic ACCESS:  PIV IVF:  OFF PROPH:  lovenox  Code Status: full code Family Communication: spoke with patient who was alone  Disposition Plan: In AM if troponins negative.    Time spent: 45 min  Renae Fickle [facility] Pager [phone]  If 7PM-7AM, please contact night-coverage [email or web address] Password [facility] 11/22/2012, 9:13 PM

## 2012-11-22 NOTE — ED Notes (Signed)
Pt asking to have chest XR performed before taking medication. Dr Rhunette Croft made aware.

## 2012-11-22 NOTE — ED Provider Notes (Addendum)
History     CSN: [id number]  Arrival date & time 11/22/12  0903   First MD Initiated Contact with Patient 11/22/12 1050      Chief Complaint  Patient presents with  . Chest Pain  . Shortness of Breath    (Consider location/radiation/quality/duration/timing/severity/associated sxs/prior treatment) HPI Comments: Pt comes to the ED with some chest pain that started last night. He has hx of DM, HTN, HL - no known CAD, or provocative testing. Pt reports that he started having chest pain last night, it is left sided, sharp, pressure like pain, non radiating. There is mild associated sob, but no nausea, diaphoresis. No recent cough, no hx of PE, DVT and no risk factors for the same. Pt denies any illicit use.  Patient is a 36 y.o. male presenting with chest pain and shortness of breath. The history is provided by the patient.  Chest Pain Primary symptoms include shortness of breath. Pertinent negatives for primary symptoms include no fever, no cough and no dizziness.    Shortness of Breath  Associated symptoms include chest pain and shortness of breath. Pertinent negatives include no fever and no cough.    Past Medical History  Diagnosis Date  . Diabetes mellitus   . Hypertension   . Hypercholesteremia   . Asthma     History reviewed. No pertinent past surgical history.  History reviewed. No pertinent family history.  History  Substance Use Topics  . Smoking status: Current Every Day Smoker -- 0.5 packs/day    Types: Cigarettes  . Smokeless tobacco: Never Used  . Alcohol Use: No      Review of Systems  Constitutional: Negative for fever, chills and activity change.  HENT: Negative for neck pain.   Eyes: Negative for visual disturbance.  Respiratory: Positive for shortness of breath. Negative for cough and chest tightness.   Cardiovascular: Positive for chest pain.  Gastrointestinal: Negative for abdominal distention.  Genitourinary: Negative for dysuria, enuresis  and difficulty urinating.  Musculoskeletal: Negative for arthralgias.  Neurological: Negative for dizziness, light-headedness and headaches.  Psychiatric/Behavioral: Negative for confusion.    Allergies  Shellfish allergy and Iodine  Home Medications   Current Outpatient Rx  Name  Route  Sig  Dispense  Refill  . ALBUTEROL SULFATE HFA 108 (90 BASE) MCG/ACT IN AERS   Inhalation   Inhale 2 puffs into the lungs every 6 (six) hours as needed. For shortness of breath and wheezing         . GLIPIZIDE ER 5 MG PO TB24   Oral   Take 5 mg by mouth daily.         Marland Kitchen HYDROCHLOROTHIAZIDE 25 MG PO TABS   Oral   Take 25 mg by mouth daily.         Marland Kitchen LISINOPRIL 10 MG PO TABS   Oral   Take 10 mg by mouth at bedtime.          Marland Kitchen METFORMIN HCL ER 500 MG PO TB24   Oral   Take 500 mg by mouth 2 (two) times daily.         Marland Kitchen PRAVASTATIN SODIUM 20 MG PO TABS   Oral   Take 20 mg by mouth at bedtime.            BP 137/60  Pulse 76  Temp 98.3 F (36.8 C) (Oral)  Resp 18  SpO2 98%  Physical Exam  Nursing note and vitals reviewed. Constitutional: He is oriented to person, place, and  time. He appears well-developed.  HENT:  Head: Normocephalic and atraumatic.  Eyes: Conjunctivae normal and EOM are normal. Pupils are equal, round, and reactive to light.  Neck: Normal range of motion. Neck supple. No JVD present.  Cardiovascular: Normal rate and regular rhythm.   Pulmonary/Chest: Effort normal and breath sounds normal.  Abdominal: Soft. Bowel sounds are normal. He exhibits no distension. There is no tenderness. There is no rebound and no guarding.  Neurological: He is alert and oriented to person, place, and time.  Skin: Skin is warm.    ED Course  Procedures (including critical care time)  Labs Reviewed  URINALYSIS, ROUTINE W REFLEX MICROSCOPIC - Abnormal; Notable for the following:    Glucose, UA 250 (*)     All other components within normal limits  CBC WITH DIFFERENTIAL  - Abnormal; Notable for the following:    Neutrophils Relative 40 (*)     All other components within normal limits  BASIC METABOLIC PANEL - Abnormal; Notable for the following:    Potassium 3.3 (*)     Glucose, Bld 118 (*)     All other components within normal limits  TROPONIN I  URINE RAPID DRUG SCREEN (HOSP PERFORMED)   Dg Chest 2 View  11/22/2012  *RADIOLOGY REPORT*  Clinical Data: Chest pain and shortness of breath.  CHEST - 2 VIEW  Comparison: 04/13/2012.  Findings: The cardiac silhouette, mediastinal and hilar contours are normal and stable.  The lungs are clear.  No pleural effusion. The bony thorax is intact.  IMPRESSION: Normal chest.  No change since prior study.   Original Report Authenticated By: Rudie Meyer, M.D.      No diagnosis found.    MDM   Date: 11/22/2012  Rate: 73  Rhythm: normal sinus rhythm  QRS Axis: normal  Intervals: normal  ST/T Wave abnormalities: nonspecific ST/T changes  Conduction Disutrbances:none  Narrative Interpretation:   Old EKG Reviewed: none available t wave inversions in the inferior leads.  Differential diagnosis includes: ACS syndrome CHF exacerbation Valvular disorder Myocarditis Pericarditis Pericardial effusion Pneumonia Pleural effusion Pulmonary edema PE  Pt comes in with cc of chest pain - pretty typical chest pain and patient has risk factors for ACS, most notably DM. His Wells score is 0, and he is PERC negative. Concerns for ACS syndrome. EKG has some t wave inversions. Chest pain didn't resolve with nitro, morphine given.  Given the typical nature of the pain, and the risk factors with EKG that is not completely reassuring - we will get patient anticoagulated.     Derwood Kaplan, MD 11/22/12 1640  4:43 PM Dr. Malachi Bonds with the patient right now. She would appreciate [place] holding off on heparin for now, and she wants to finish her evaluation. Will continue with the admission plan, but deferring  anticoagulation to Dr. Malachi Bonds.  Derwood Kaplan, MD 11/22/12 [phone]

## 2012-11-23 DIAGNOSIS — R072 Precordial pain: Secondary | ICD-10-CM

## 2012-11-23 DIAGNOSIS — J309 Allergic rhinitis, unspecified: Secondary | ICD-10-CM

## 2012-11-23 LAB — BASIC METABOLIC PANEL
BUN: 14 mg/dL (ref 6–23)
Chloride: 101 mEq/L (ref 96–112)
Creatinine, Ser: 0.96 mg/dL (ref 0.50–1.35)
Glucose, Bld: 140 mg/dL — ABNORMAL HIGH (ref 70–99)
Potassium: 3.6 mEq/L (ref 3.5–5.1)

## 2012-11-23 LAB — CBC
MCHC: 32.9 g/dL (ref 30.0–36.0)
Platelets: 259 10*3/uL (ref 150–400)
RDW: 13 % (ref 11.5–15.5)
WBC: 6.2 10*3/uL (ref 4.0–10.5)

## 2012-11-23 LAB — LIPID PANEL
Cholesterol: 168 mg/dL (ref 0–200)
Total CHOL/HDL Ratio: 5.3 RATIO
Triglycerides: 123 mg/dL (ref <150)

## 2012-11-23 LAB — TROPONIN I: Troponin I: <0.3 ng/mL (ref <0.30)

## 2012-11-23 MED ORDER — OXYCODONE-ACETAMINOPHEN 5-325 MG PO TABS: 1.0000 | ORAL_TABLET | ORAL | Status: AC | PRN

## 2012-11-23 MED ORDER — ASPIRIN 81 MG PO TBEC: 81.0000 mg | DELAYED_RELEASE_TABLET | Freq: Every day | ORAL | Status: AC

## 2012-11-23 MED ORDER — NITROGLYCERIN 0.4 MG SL SUBL: 0.4000 mg | SUBLINGUAL_TABLET | SUBLINGUAL | Status: AC | PRN

## 2012-11-23 NOTE — Discharge Summary (Signed)
Physician Discharge Summary  Patient ID: Robert Walter MRN: [id number] DOB/AGE: 1976/04/13 36 y.o.  Admit date: 11/22/2012 Discharge date: 11/23/2012  Primary Care Physician: per patient, Dr Veatrice Kells (?) in [facility]  Discharge Diagnoses:    Atypical chest pain with some musculoskeletal features . Hypercholesteremia . Hypertension . Obesity . Tobacco use  Consults:  Dr Daleen Squibb ([facility] on phone)  Discharge Medications:   Medication List     As of 11/23/2012  9:31 AM    TAKE these medications         albuterol 108 (90 BASE) MCG/ACT inhaler   Commonly known as: PROVENTIL HFA;VENTOLIN HFA   Inhale 2 puffs into the lungs every 6 (six) hours as needed. For shortness of breath and wheezing      aspirin 81 MG EC tablet   Take 1 tablet (81 mg total) by mouth daily.      glipiZIDE 5 MG 24 hr tablet   Commonly known as: GLUCOTROL XL   Take 5 mg by mouth daily.      hydrochlorothiazide 25 MG tablet   Commonly known as: HYDRODIURIL   Take 25 mg by mouth daily.      lisinopril 10 MG tablet   Commonly known as: PRINIVIL,ZESTRIL   Take 10 mg by mouth at bedtime.      metFORMIN 500 MG 24 hr tablet   Commonly known as: GLUCOPHAGE-XR   Take 500 mg by mouth 2 (two) times daily.      nitroGLYCERIN 0.4 MG SL tablet   Commonly known as: NITROSTAT   Place 1 tablet (0.4 mg total) under the tongue every 5 (five) minutes as needed for chest pain.      oxyCODONE-acetaminophen 5-325 MG per tablet   Commonly known as: PERCOCET/ROXICET   Take 1 tablet by mouth every 4 (four) hours as needed for pain.      pravastatin 20 MG tablet   Commonly known as: PRAVACHOL   Take 20 mg by mouth at bedtime.         Brief H and P: For complete details please refer to admission H and P, but in brief The patient is a 36 yo male with history of HTN (7 years), HLD (2 years), T2DM (2 years), tobacco use. Developed 8/10 substernal to left chest tightness that radiated to the left neck. It was  associated with a slight headache, no increased SOB. He denied associated nausea, diaphoresis. He was able to sleep and felt better. On the day of admission, he woke up without pain, but as he became more active, and the pain returned. He again did not have SOB, nausea, diaphoresis with the pain, but he did feel lightheaded. He went back to sleep but pain persisted. Pain was worse with movement of the left shoulder and arm. In the ER, he received NTG which did not help his pain initially, but an hour after his last dose, his pain improved from an 8/10 to a 7/10.     Hospital Course:  Atypical chest pain with some musculoskeletal features: Patient was admitted under observation on telemetry, 3 sets of cardiac enzymes were negative. EKG did show T-wave inversions in the inferior and lateral leads similar to previous ekg in 03/2012. Patient also reported worsening of chest pain on raising his left arm. He reported the chest pain worse after picking up his 27 month old baby (21lbs). Given the risk factors of HTN, HLP and diabetes, he needs stress test for stratification. I discussed in detail  with Dr Daleen Squibb and patient's stress test is arranged on 11/28/12.    Day of Discharge BP 106/78  Pulse 69  Temp 98 F (36.7 C) (Oral)  Resp 16  Ht 5\' 9"  (1.753 m)  Wt 104.327 kg (230 lb)  BMI 33.96 kg/m2  SpO2 98%  Physical Exam: General: Alert and awake oriented x3 not in any acute distress. HEENT: anicteric sclera, pupils reactive to light and accommodation CVS: S1-S2 clear no murmur rubs or gallops Chest: clear to auscultation bilaterally, no wheezing rales or rhonchi. Reproducible chest wall tenderness Abdomen: soft nontender, nondistended, normal bowel sounds, no organomegaly Extremities: no cyanosis, clubbing or edema noted bilaterally Neuro: Cranial nerves II-XII intact, no focal neurological deficits   The results of significant diagnostics from this hospitalization (including imaging, microbiology,  ancillary and laboratory) are listed below for reference.    LAB RESULTS: Basic Metabolic Panel:  Lab 11/23/12 1914 11/22/12 1839 11/22/12 1331  NA 136 -- 138  K 3.6 -- 3.3*  CL 101 -- 99  CO2 25 -- 28  GLUCOSE 140* -- 118*  BUN 14 -- 16  CREATININE 0.96 1.14 --  CALCIUM 8.9 -- 9.2  MG -- -- --  PHOS -- -- --   CBC:  Lab 11/23/12 0610 11/22/12 1839 11/22/12 1331  WBC 6.2 7.2 --  NEUTROABS -- -- 2.1  HGB 13.8 14.0 --  HCT 41.9 42.1 --  MCV 78.2 -- --  PLT 259 279 --   Cardiac Enzymes:  Lab 11/23/12 0610 11/22/12 1839  CKTOTAL -- --  CKMB -- --  CKMBINDEX -- --  TROPONINI <0.30 <0.30   BNP: No components found with this basename: POCBNP:2 CBG:  Lab 11/23/12 0746  GLUCAP 162*    Significant Diagnostic Studies:  Dg Chest 2 View  11/22/2012  *RADIOLOGY REPORT*  Clinical Data: Chest pain and shortness of breath.  CHEST - 2 VIEW  Comparison: 04/13/2012.  Findings: The cardiac silhouette, mediastinal and hilar contours are normal and stable.  The lungs are clear.  No pleural effusion. The bony thorax is intact.  IMPRESSION: Normal chest.  No change since prior study.   Original Report Authenticated By: Rudie Meyer, M.D.      Disposition and Follow-up:     Discharge Orders    Future Appointments: Provider: Department: Dept Phone: Center:   11/28/2012 9:00 AM [facility] Nuclear 2 (Nuc Treadm) [facility] SITE 3 NUCLEAR MED [id number] None     Future Orders Please Complete By Expires   Diet Carb Modified      Increase activity slowly      Discharge instructions      Comments:   Your stress test is scheduled on 11/28/12 at 9:00 am. Please stay NPO (nothing by mouth) after midnight and no caffeine for the stress test on 12/10.       DISPOSITION: home DIET: carb modified diet  ACTIVITY: as tolerated    DISCHARGE FOLLOW-UP Follow-up Information    Follow up with [facility]. On 11/28/2012. (at 9:00 AM. Office will call with date and  time for stress test. )    Contact information:   [address] [place] [address]          Time spent on Discharge: 35 mins  Signed:   Blaiden Werth M.D. [facility] 11/23/2012, 9:31 AM Pager: [phone]

## 2012-11-23 NOTE — Progress Notes (Signed)
Echocardiogram 2D Echocardiogram has been performed.  Brittan Mapel 11/23/2012, 10:22 AM

## 2012-11-28 ENCOUNTER — Ambulatory Visit (HOSPITAL_COMMUNITY): Payer: No Typology Code available for payment source | Attending: Cardiology | Admitting: Radiology

## 2012-11-28 VITALS — BP 98/56 | Ht 69.0 in | Wt 228.0 lb

## 2012-11-28 DIAGNOSIS — R079 Chest pain, unspecified: Secondary | ICD-10-CM | POA: Insufficient documentation

## 2012-11-28 DIAGNOSIS — R0602 Shortness of breath: Secondary | ICD-10-CM

## 2012-11-28 DIAGNOSIS — J45909 Unspecified asthma, uncomplicated: Secondary | ICD-10-CM | POA: Insufficient documentation

## 2012-11-28 DIAGNOSIS — R9431 Abnormal electrocardiogram [ECG] [EKG]: Secondary | ICD-10-CM | POA: Insufficient documentation

## 2012-11-28 DIAGNOSIS — I10 Essential (primary) hypertension: Secondary | ICD-10-CM | POA: Insufficient documentation

## 2012-11-28 DIAGNOSIS — E785 Hyperlipidemia, unspecified: Secondary | ICD-10-CM | POA: Insufficient documentation

## 2012-11-28 DIAGNOSIS — F17200 Nicotine dependence, unspecified, uncomplicated: Secondary | ICD-10-CM | POA: Insufficient documentation

## 2012-11-28 DIAGNOSIS — E119 Type 2 diabetes mellitus without complications: Secondary | ICD-10-CM | POA: Insufficient documentation

## 2012-11-28 MED ORDER — TECHNETIUM TC 99M SESTAMIBI GENERIC - CARDIOLITE
9.7000 | Freq: Once | INTRAVENOUS | Status: AC | PRN
  Administered 2012-11-28: 10 via INTRAVENOUS

## 2012-11-28 MED ORDER — TECHNETIUM TC 99M SESTAMIBI GENERIC - CARDIOLITE
32.6000 | Freq: Once | INTRAVENOUS | Status: AC | PRN
  Administered 2012-11-28: 33 via INTRAVENOUS

## 2012-11-28 NOTE — Progress Notes (Signed)
[facility] SITE 3 NUCLEAR MED [id number] [place] [address] [phone]  Cardiology Nuclear Med Study  Robert Walter is a 36 y.o. male     MRN : [id number]     DOB: 10/01/1976  Procedure Date: 11/28/2012  Nuclear Med Background Indication for Stress Test:  Evaluation for Ischemia, Post Hospital: ER at [facility] with Chest pain and SB and (-) enzymes and Abnormal EKG: Borderline Twave abnormality History:  Asthma and 2003: NL per pt @ [facility] Cardiac Risk Factors: Hypertension, Lipids, NIDDM and Smoker  Symptoms:  Chest Pain   Nuclear Pre-Procedure Caffeine/Decaff Intake:  None NPO After: 7:00pm   Lungs:  clear O2 Sat: 96% on room air. IV 0.9% NS with Angio Cath:  20g  IV Site: R Hand  IV Started by:  Cathlyn Parsons, RN  Chest Size (in):  52 Cup Size: n/a  Height: 5\' 9"  (1.753 m)  Weight:  228 lb (103.42 kg)  BMI:  Body mass index is 33.67 kg/(m^2). Tech Comments:  n/a    Nuclear Med Study 1 or 2 day study: 1 day  Stress Test Type:  Stress  Reading MD: Olga Millers, MD  Order Authorizing Provider:  Annice Pih  Resting Radionuclide: Technetium 32m Sestamibi  Resting Radionuclide Dose: 9.7 mCi   Stress Radionuclide:  Technetium 26m Sestamibi  Stress Radionuclide Dose: 32.6 mCi           Stress Protocol Rest HR: 162 Stress HR: 162  Rest BP: 98/56 Stress BP: 169/72  Exercise Time (min): 12:00 METS: 13.40   Predicted Max HR: 184 bpm % Max HR: 88.04 bpm Rate Pressure Product: 42595   Dose of Adenosine (mg):  n/a Dose of Lexiscan: n/a mg  Dose of Atropine (mg): n/a Dose of Dobutamine: n/a mcg/kg/min (at max HR)  Stress Test Technologist: Milana Na, EMT-P  Nuclear Technologist:  Domenic Polite, CNMT     Rest Procedure:  Myocardial perfusion imaging was performed at rest 45 minutes following the intravenous administration of Technetium 82m Sestamibi. Rest ECG: NSR with nonspecific Twave changes.  Stress Procedure:  The  patient performed treadmill exercise using a Bruce  Protocol for 12:00 minutes. The patient stopped due to sob, fatigue and denied any chest pain and a rare pvc. Technetium 77m Sestamibi was injected at peak exercise and myocardial perfusion imaging was performed after a brief delay. Stress ECG: No significant ST segment change suggestive of ischemia.  QPS Raw Data Images:  Acquisition technically good; normal left ventricular size. Stress Images:  Normal homogeneous uptake in all areas of the myocardium. Rest Images:  Normal homogeneous uptake in all areas of the myocardium. Subtraction (SDS):  No evidence of ischemia. Transient Ischemic Dilatation (Normal <1.22):  1.05 Lung/Heart Ratio (Normal <0.45):  0.33  Quantitative Gated Spect Images QGS EDV:  95 ml QGS ESV:  37 ml  Impression Exercise Capacity:  Good exercise capacity. BP Response:  Normal blood pressure response. Clinical Symptoms:  There is dyspnea. ECG Impression:  No significant ST segment change suggestive of ischemia. Comparison with Prior Nuclear Study: No images to compare  Overall Impression:  Normal stress nuclear study.  LV Ejection Fraction: 61%.  LV Wall Motion:  NL LV Function; NL Wall Motion  Olga Millers

## 2014-10-22 ENCOUNTER — Encounter (HOSPITAL_COMMUNITY): Payer: Self-pay | Admitting: Adult Health

## 2014-10-22 DIAGNOSIS — E119 Type 2 diabetes mellitus without complications: Secondary | ICD-10-CM | POA: Insufficient documentation

## 2014-10-22 DIAGNOSIS — R110 Nausea: Secondary | ICD-10-CM | POA: Insufficient documentation

## 2014-10-22 DIAGNOSIS — R0600 Dyspnea, unspecified: Secondary | ICD-10-CM | POA: Insufficient documentation

## 2014-10-22 DIAGNOSIS — R079 Chest pain, unspecified: Secondary | ICD-10-CM | POA: Insufficient documentation

## 2014-10-22 DIAGNOSIS — K219 Gastro-esophageal reflux disease without esophagitis: Secondary | ICD-10-CM | POA: Insufficient documentation

## 2014-10-22 DIAGNOSIS — Z720 Tobacco use: Secondary | ICD-10-CM | POA: Insufficient documentation

## 2014-10-22 DIAGNOSIS — Z872 Personal history of diseases of the skin and subcutaneous tissue: Secondary | ICD-10-CM | POA: Insufficient documentation

## 2014-10-22 DIAGNOSIS — R61 Generalized hyperhidrosis: Secondary | ICD-10-CM | POA: Insufficient documentation

## 2014-10-22 DIAGNOSIS — E669 Obesity, unspecified: Secondary | ICD-10-CM | POA: Insufficient documentation

## 2014-10-22 DIAGNOSIS — I10 Essential (primary) hypertension: Secondary | ICD-10-CM | POA: Insufficient documentation

## 2014-10-22 DIAGNOSIS — E780 Pure hypercholesterolemia: Secondary | ICD-10-CM | POA: Insufficient documentation

## 2014-10-22 DIAGNOSIS — J45909 Unspecified asthma, uncomplicated: Secondary | ICD-10-CM | POA: Insufficient documentation

## 2014-10-22 DIAGNOSIS — Z79899 Other long term (current) drug therapy: Secondary | ICD-10-CM | POA: Insufficient documentation

## 2014-10-22 DIAGNOSIS — Z7982 Long term (current) use of aspirin: Secondary | ICD-10-CM | POA: Insufficient documentation

## 2014-10-22 LAB — CBC
HCT: 43.5 % (ref 39.0–52.0)
HEMOGLOBIN: 14.3 g/dL (ref 13.0–17.0)
MCH: 26.2 pg (ref 26.0–34.0)
MCHC: 32.9 g/dL (ref 30.0–36.0)
MCV: 79.8 fL (ref 78.0–100.0)
PLATELETS: 254 10*3/uL (ref 150–400)
RBC: 5.45 MIL/uL (ref 4.22–5.81)
RDW: 12.6 % (ref 11.5–15.5)
WBC: 6.8 10*3/uL (ref 4.0–10.5)

## 2014-10-22 LAB — BASIC METABOLIC PANEL
ANION GAP: 12 (ref 5–15)
BUN: 17 mg/dL (ref 6–23)
CALCIUM: 9.3 mg/dL (ref 8.4–10.5)
CO2: 29 mEq/L (ref 19–32)
Chloride: 102 mEq/L (ref 96–112)
Creatinine, Ser: 1.2 mg/dL (ref 0.50–1.35)
GFR, EST AFRICAN AMERICAN: 87 mL/min — AB (ref >90)
GFR, EST NON AFRICAN AMERICAN: 75 mL/min — AB (ref >90)
Glucose, Bld: 234 mg/dL — ABNORMAL HIGH (ref 70–99)
POTASSIUM: 4 meq/L (ref 3.7–5.3)
SODIUM: 143 meq/L (ref 137–147)

## 2014-10-22 LAB — I-STAT TROPONIN, ED: TROPONIN I, POC: 0.02 ng/mL (ref 0.00–0.08)

## 2014-10-22 NOTE — ED Notes (Addendum)
Presents with 2 weeks of intermittent sternal chest pain associated with SOB described as tightness. Lying flat makes pain worse, having a BM makes pain better. Rates pain 9/10. Breath sounds clear

## 2014-10-23 ENCOUNTER — Emergency Department (HOSPITAL_COMMUNITY)
Admission: EM | Admit: 2014-10-23 | Discharge: 2014-10-23 | Disposition: A | Discharge status: Home / Self Care | Payer: No Typology Code available for payment source | Attending: Emergency Medicine | Admitting: Emergency Medicine

## 2014-10-23 ENCOUNTER — Emergency Department (HOSPITAL_COMMUNITY): Payer: No Typology Code available for payment source

## 2014-10-23 DIAGNOSIS — R079 Chest pain, unspecified: Secondary | ICD-10-CM

## 2014-10-23 DIAGNOSIS — K219 Gastro-esophageal reflux disease without esophagitis: Secondary | ICD-10-CM

## 2014-10-23 MED ORDER — GI COCKTAIL ~~LOC~~
30.0000 mL | Freq: Once | ORAL | Status: AC
  Administered 2014-10-23: 30 mL via ORAL
  Filled 2014-10-23: qty 30

## 2014-10-23 MED ORDER — PANTOPRAZOLE SODIUM 40 MG PO TBEC
40.0000 mg | DELAYED_RELEASE_TABLET | Freq: Once | ORAL | Status: AC
  Administered 2014-10-23: 40 mg via ORAL
  Filled 2014-10-23: qty 1

## 2014-10-23 NOTE — ED Provider Notes (Addendum)
CSN: [id number]     Arrival date & time 10/22/14  2205 History   First MD Initiated Contact with Patient 10/23/14 0114     Chief Complaint  Patient presents with  . Chest Pain     (Consider location/radiation/quality/duration/timing/severity/associated sxs/prior Treatment) Patient is a 38 y.o. male presenting with chest pain. The history is provided by the patient.  Chest Pain He has been having chest pains intermittently for the last week. Pain is described as a heavy feeling in his chest. It tends to be worse if he lays down but not worse with exertion. It seems to get better after having a bowel movement. There is associated dyspnea, nausea, diaphoresis. He had similar episode of pain 2 years ago for which she was admitted to the hospital and had a negative stress test. He is a smoker with a history of diabetes, hypertension, hyperlipidemia. He rated his pain at 9/10 at its worse.  Past Medical History  Diagnosis Date  . Diabetes mellitus   . Hypertension   . Hypercholesteremia   . Asthma   . Eczema   . Allergic rhinitis   . Tobacco use   . Obesity    History reviewed. No pertinent past surgical history. Family History  Problem Relation Age of Onset  . Bronchitis Mother   . Diabetes Father     borderline  . High blood pressure Brother   . Diabetes Brother    History  Substance Use Topics  . Smoking status: Current Every Day Smoker -- 0.50 packs/day    Types: Cigarettes  . Smokeless tobacco: Never Used  . Alcohol Use: No    Review of Systems  Cardiovascular: Positive for chest pain.  All other systems reviewed and are negative.     Allergies  Shellfish allergy and Iodine  Home Medications   Prior to Admission medications   Medication Sig Start Date End Date Taking? Authorizing Provider  albuterol (PROVENTIL HFA;VENTOLIN HFA) 108 (90 BASE) MCG/ACT inhaler Inhale 2 puffs into the lungs every 6 (six) hours as needed. For shortness of breath and wheezing   Yes  Historical Provider, MD  glipiZIDE (GLUCOTROL XL) 5 MG 24 hr tablet Take 5 mg by mouth daily.   Yes Historical Provider, MD  hydrochlorothiazide (HYDRODIURIL) 25 MG tablet Take 25 mg by mouth daily.   Yes Historical Provider, MD  lisinopril (PRINIVIL,ZESTRIL) 10 MG tablet Take 10 mg by mouth at bedtime.    Yes Historical Provider, MD  metFORMIN (GLUCOPHAGE-XR) 500 MG 24 hr tablet Take 500 mg by mouth 2 (two) times daily.   Yes Historical Provider, MD  nitroGLYCERIN (NITROSTAT) 0.4 MG SL tablet Place 1 tablet (0.4 mg total) under the tongue every 5 (five) minutes as needed for chest pain. 11/23/12  Yes Ripudeep Jenna LuoK Rai, MD  oxyCODONE-acetaminophen (ROXICET) 5-325 MG per tablet Take 1 tablet by mouth every 4 (four) hours as needed for pain. 11/23/12  Yes Ripudeep Jenna LuoK Rai, MD  pravastatin (PRAVACHOL) 20 MG tablet Take 20 mg by mouth at bedtime.    Yes Historical Provider, MD  aspirin EC 81 MG EC tablet Take 1 tablet (81 mg total) by mouth daily. 11/23/12   Ripudeep K Rai, MD   BP 147/87 mmHg  Pulse 87  Temp(Src) 98.4 F (36.9 C) (Oral)  Resp 18  SpO2 100% Physical Exam  Nursing note and vitals reviewed.  38 year old male, resting comfortably and in no acute distress. Vital signs are significant for hypertension. Oxygen saturation is 100%, which  is normal. Head is normocephalic and atraumatic. PERRLA, EOMI. Oropharynx is clear. Neck is nontender and supple without adenopathy or JVD. Back is nontender and there is no CVA tenderness. Lungs are clear without rales, wheezes, or rhonchi. Chest is nontender. Heart has regular rate and rhythm without murmur. Abdomen is soft, flat, nontender without masses or hepatosplenomegaly and peristalsis is normoactive. Extremities have no cyanosis or edema, full range of motion is present. Skin is warm and dry without rash. Neurologic: Mental status is normal, cranial nerves are intact, there are no motor or sensory deficits.  ED Course  Procedures (including  critical care time) Labs Review Results for orders placed or performed during the hospital encounter of 10/23/14  CBC  Result Value Ref Range   WBC 6.8 4.0 - 10.5 K/uL   RBC 5.45 4.22 - 5.81 MIL/uL   Hemoglobin 14.3 13.0 - 17.0 g/dL   HCT 16.1 09.6 - 04.5 %   MCV 79.8 78.0 - 100.0 fL   MCH 26.2 26.0 - 34.0 pg   MCHC 32.9 30.0 - 36.0 g/dL   RDW 40.9 81.1 - 91.4 %   Platelets 254 150 - 400 K/uL  Basic metabolic panel  Result Value Ref Range   Sodium 143 137 - 147 mEq/L   Potassium 4.0 3.7 - 5.3 mEq/L   Chloride 102 96 - 112 mEq/L   CO2 29 19 - 32 mEq/L   Glucose, Bld 234 (H) 70 - 99 mg/dL   BUN 17 6 - 23 mg/dL   Creatinine, Ser 7.82 0.50 - 1.35 mg/dL   Calcium 9.3 8.4 - 95.6 mg/dL   GFR calc non Af Amer 75 (L) >90 mL/min   GFR calc Af Amer 87 (L) >90 mL/min   Anion gap 12 5 - 15  I-stat troponin, ED (not at [facility])  Result Value Ref Range   Troponin i, poc 0.02 0.00 - 0.08 ng/mL   Comment 3           Imaging Review Dg Chest 2 View  10/23/2014   CLINICAL DATA:  Chest pain and possible fever.  EXAM: CHEST  2 VIEW  COMPARISON:  11/22/2012  FINDINGS: Heart and mediastinum are within normal limits. Lungs are clear bilaterally. There may be mild stable scarring at the right lung apex. No acute bone abnormality.  IMPRESSION: No active cardiopulmonary disease.   Electronically Signed   By: Richarda Overlie M.D.   On: 10/23/2014 00:55     EKG Interpretation   Date/Time:  Tuesday October 22 2014 22:11:42 EST Ventricular Rate:  86 PR Interval:  154 QRS Duration: 60 QT Interval:  348 QTC Calculation: 416 R Axis:   6 Text Interpretation:  Normal sinus rhythm Nonspecific T wave abnormality  Abnormal ECG When compared with ECG of 11/22/2012, No significant change  was found Confirmed by [facility]  MD, Orie Baxendale ([id number]) on 10/23/2014 1:14:59 AM      MDM   Final diagnoses:  Chest pain, unspecified chest pain type  GERD without esophagitis    Chest pain of uncertain cause. Initial workup is  unremarkable. ECG is unchanged from baseline and troponin is normal and x-ray is unremarkable. Old records are reviewed confirming hospital admission 2 years ago with negative stress nuclear study. He will be given a trial of GI cocktail. At this point, I am suspicious that he has GERD.  He had good relief of pain with GI cocktail. He is advised to take over-the-counter omeprazole once a day and is referred to cardiology  for outpatient stress testing.  Dione Boozeavid Ana Liaw, MD 10/23/14 [id number]  Dione Boozeavid Harith Mccadden, MD 11/15/14 [phone]

## 2014-10-23 NOTE — Discharge Instructions (Signed)
Take omeprazole (Prilosec OTC) once a day.  Chest Pain (Nonspecific) It is often hard to give a specific diagnosis for the cause of chest pain. There is always a chance that your pain could be related to something serious, such as a heart attack or a blood clot in the lungs. You need to follow up with your health care provider for further evaluation. CAUSES   Heartburn.  Pneumonia or bronchitis.  Anxiety or stress.  Inflammation around your heart (pericarditis) or lung (pleuritis or pleurisy).  A blood clot in the lung.  A collapsed lung (pneumothorax). It can develop suddenly on its own (spontaneous pneumothorax) or from trauma to the chest.  Shingles infection (herpes zoster virus). The chest wall is composed of bones, muscles, and cartilage. Any of these can be the source of the pain.  The bones can be bruised by injury.  The muscles or cartilage can be strained by coughing or overwork.  The cartilage can be affected by inflammation and become sore (costochondritis). DIAGNOSIS  Lab tests or other studies may be needed to find the cause of your pain. Your health care provider may have you take a test called an ambulatory electrocardiogram (ECG). An ECG records your heartbeat patterns over a 24-hour period. You may also have other tests, such as:  Transthoracic echocardiogram (TTE). During echocardiography, sound waves are used to evaluate how blood flows through your heart.  Transesophageal echocardiogram (TEE).  Cardiac monitoring. This allows your health care provider to monitor your heart rate and rhythm in real time.  Holter monitor. This is a portable device that records your heartbeat and can help diagnose heart arrhythmias. It allows your health care provider to track your heart activity for several days, if needed.  Stress tests by exercise or by giving medicine that makes the heart beat faster. TREATMENT   Treatment depends on what may be causing your chest pain.  Treatment may include:  Acid blockers for heartburn.  Anti-inflammatory medicine.  Pain medicine for inflammatory conditions.  Antibiotics if an infection is present.  You may be advised to change lifestyle habits. This includes stopping smoking and avoiding alcohol, caffeine, and chocolate.  You may be advised to keep your head raised (elevated) when sleeping. This reduces the chance of acid going backward from your stomach into your esophagus. Most of the time, nonspecific chest pain will improve within 2-3 days with rest and mild pain medicine.  HOME CARE INSTRUCTIONS   If antibiotics were prescribed, take them as directed. Finish them even if you start to feel better.  For the next few days, avoid physical activities that bring on chest pain. Continue physical activities as directed.  Do not use any tobacco products, including cigarettes, chewing tobacco, or electronic cigarettes.  Avoid drinking alcohol.  Only take medicine as directed by your health care provider.  Follow your health care provider's suggestions for further testing if your chest pain does not go away.  Keep any follow-up appointments you made. If you do not go to an appointment, you could develop lasting (chronic) problems with pain. If there is any problem keeping an appointment, call to reschedule. SEEK MEDICAL CARE IF:   Your chest pain does not go away, even after treatment.  You have a rash with blisters on your chest.  You have a fever. SEEK IMMEDIATE MEDICAL CARE IF:   You have increased chest pain or pain that spreads to your arm, neck, jaw, back, or abdomen.  You have shortness of breath.  You have an increasing cough, or you cough up blood.  You have severe back or abdominal pain.  You feel nauseous or vomit.  You have severe weakness.  You faint.  You have chills. This is an emergency. Do not wait to see if the pain will go away. Get medical help at once. Call your local emergency  services (911 in [place] Do not drive yourself to the hospital. MAKE SURE YOU:   Understand these instructions.  Will watch your condition.  Will get help right away if you are not doing well or get worse. Document Released: 09/15/2005 Document Revised: 12/11/2013 Document Reviewed: 07/11/2008 [facility] Patient Information 2015 [place], [place]. This information is not intended to replace advice given to you by your health care provider. Make sure you discuss any questions you have with your health care provider.  Gastroesophageal Reflux Disease, Adult Gastroesophageal reflux disease (GERD) happens when acid from your stomach flows up into the esophagus. When acid comes in contact with the esophagus, the acid causes soreness (inflammation) in the esophagus. Over time, GERD may create small holes (ulcers) in the lining of the esophagus. CAUSES   Increased body weight. This puts pressure on the stomach, making acid rise from the stomach into the esophagus.  Smoking. This increases acid production in the stomach.  Drinking alcohol. This causes decreased pressure in the lower esophageal sphincter (valve or ring of muscle between the esophagus and stomach), allowing acid from the stomach into the esophagus.  Late evening meals and a full stomach. This increases pressure and acid production in the stomach.  A malformed lower esophageal sphincter. Sometimes, no cause is found. SYMPTOMS   Burning pain in the lower part of the mid-chest behind the breastbone and in the mid-stomach area. This may occur twice a week or more often.  Trouble swallowing.  Sore throat.  Dry cough.  Asthma-like symptoms including chest tightness, shortness of breath, or wheezing. DIAGNOSIS  Your caregiver may be able to diagnose GERD based on your symptoms. In some cases, X-rays and other tests may be done to check for complications or to check the condition of your stomach and esophagus. TREATMENT  Your  caregiver may recommend over-the-counter or prescription medicines to help decrease acid production. Ask your caregiver before starting or adding any new medicines.  HOME CARE INSTRUCTIONS   Change the factors that you can control. Ask your caregiver for guidance concerning weight loss, quitting smoking, and alcohol consumption.  Avoid foods and drinks that make your symptoms worse, such as:  Caffeine or alcoholic drinks.  Chocolate.  Peppermint or mint flavorings.  Garlic and onions.  Spicy foods.  Citrus fruits, such as oranges, lemons, or limes.  Tomato-based foods such as sauce, chili, salsa, and pizza.  Fried and fatty foods.  Avoid lying down for the 3 hours prior to your bedtime or prior to taking a nap.  Eat small, frequent meals instead of large meals.  Wear loose-fitting clothing. Do not wear anything tight around your waist that causes pressure on your stomach.  Raise the head of your bed 6 to 8 inches with wood blocks to help you sleep. Extra pillows will not help.  Only take over-the-counter or prescription medicines for pain, discomfort, or fever as directed by your caregiver.  Do not take aspirin, ibuprofen, or other nonsteroidal anti-inflammatory drugs (NSAIDs). SEEK IMMEDIATE MEDICAL CARE IF:   You have pain in your arms, neck, jaw, teeth, or back.  Your pain increases or changes in intensity or  duration.  You develop nausea, vomiting, or sweating (diaphoresis).  You develop shortness of breath, or you faint.  Your vomit is green, yellow, black, or looks like coffee grounds or blood.  Your stool is red, bloody, or black. These symptoms could be signs of other problems, such as heart disease, gastric bleeding, or esophageal bleeding. MAKE SURE YOU:   Understand these instructions.  Will watch your condition.  Will get help right away if you are not doing well or get worse. Document Released: 09/15/2005 Document Revised: 02/28/2012 Document  Reviewed: 06/25/2011 [facility] Patient Information 2015 [employer], [place]. This information is not intended to replace advice given to you by your health care provider. Make sure you discuss any questions you have with your health care provider.

## 2015-03-07 IMAGING — CR DG CHEST 2V
2 series · 2 of 2 positions shown · non-contrast
Comparison: 11/22/2012

CLINICAL DATA: Chest pain and possible fever.

EXAM:
CHEST  2 VIEW

[w chest pa]
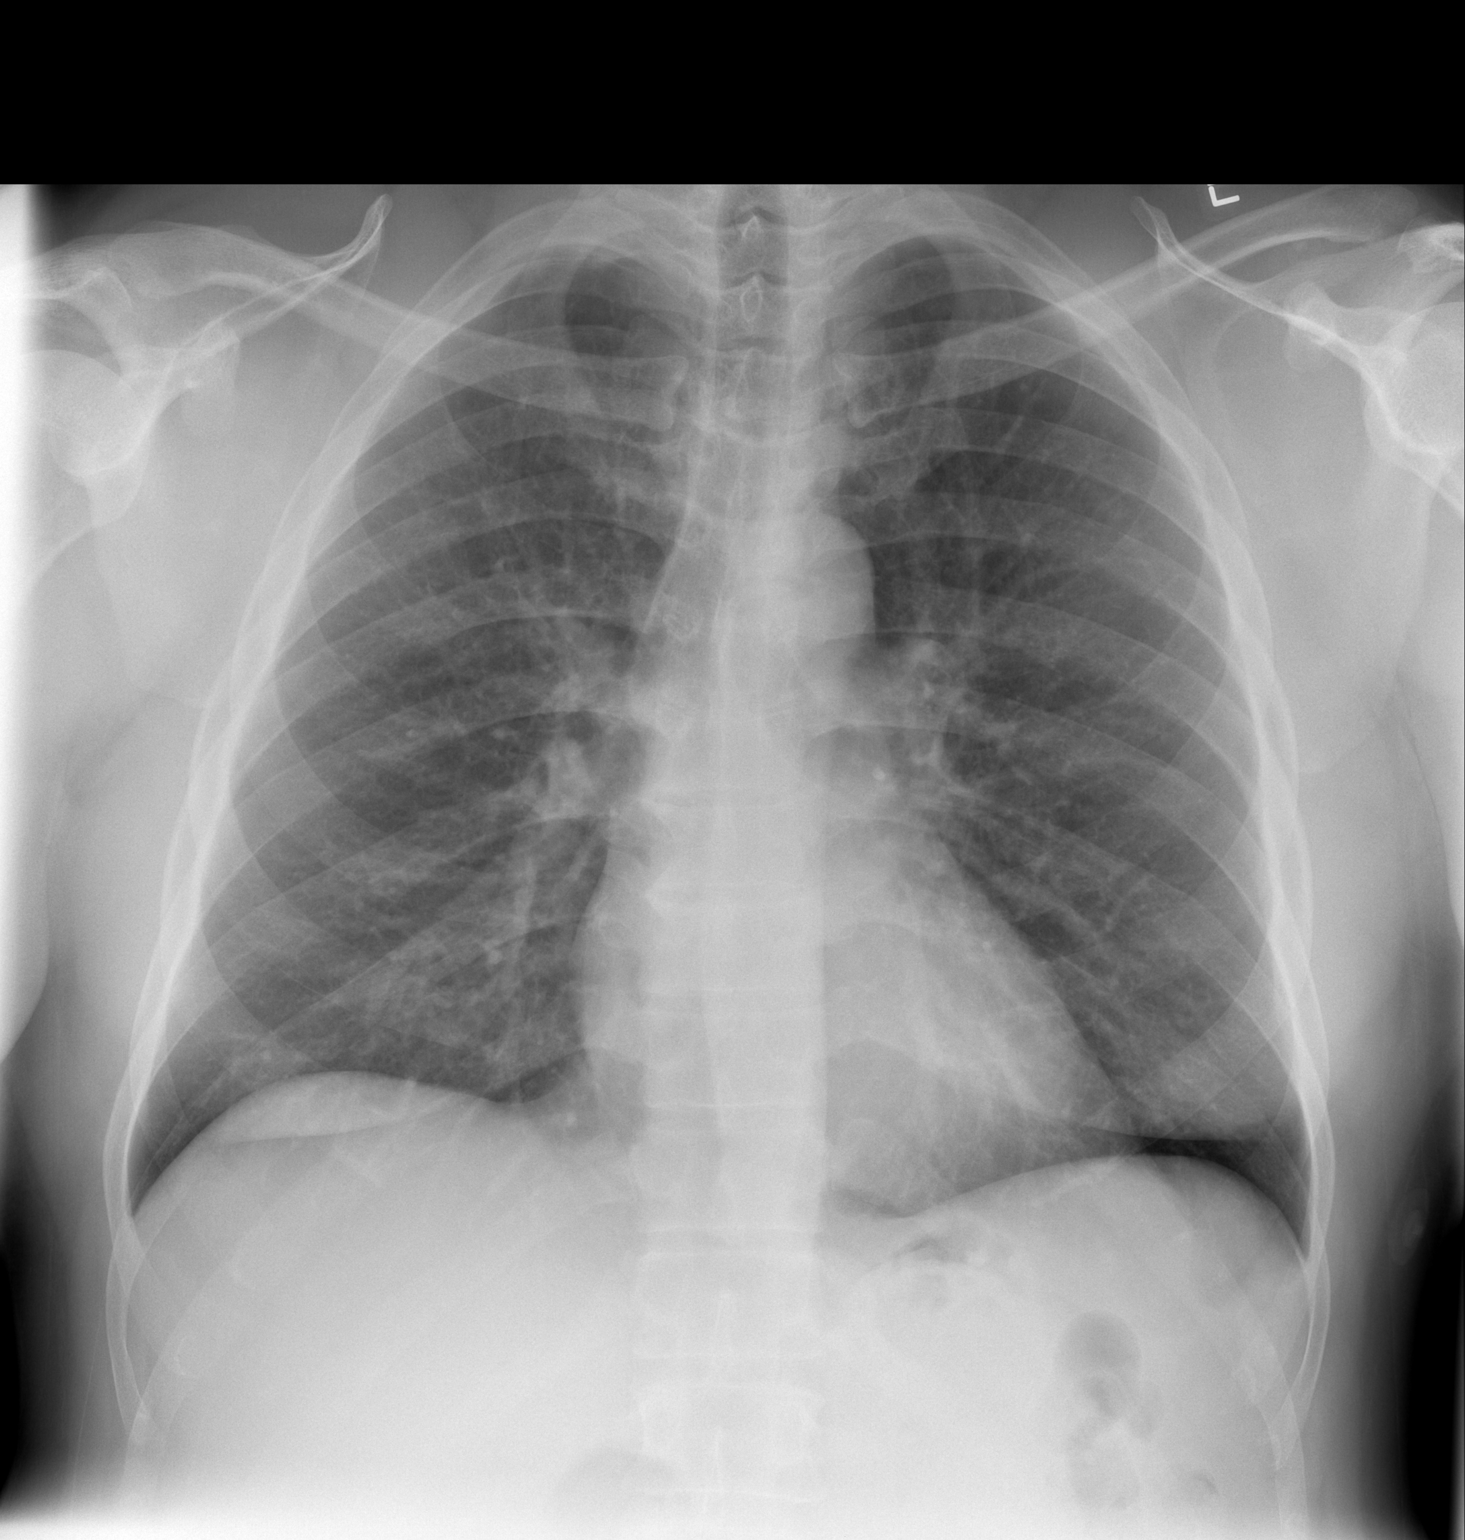

[w chest lat]
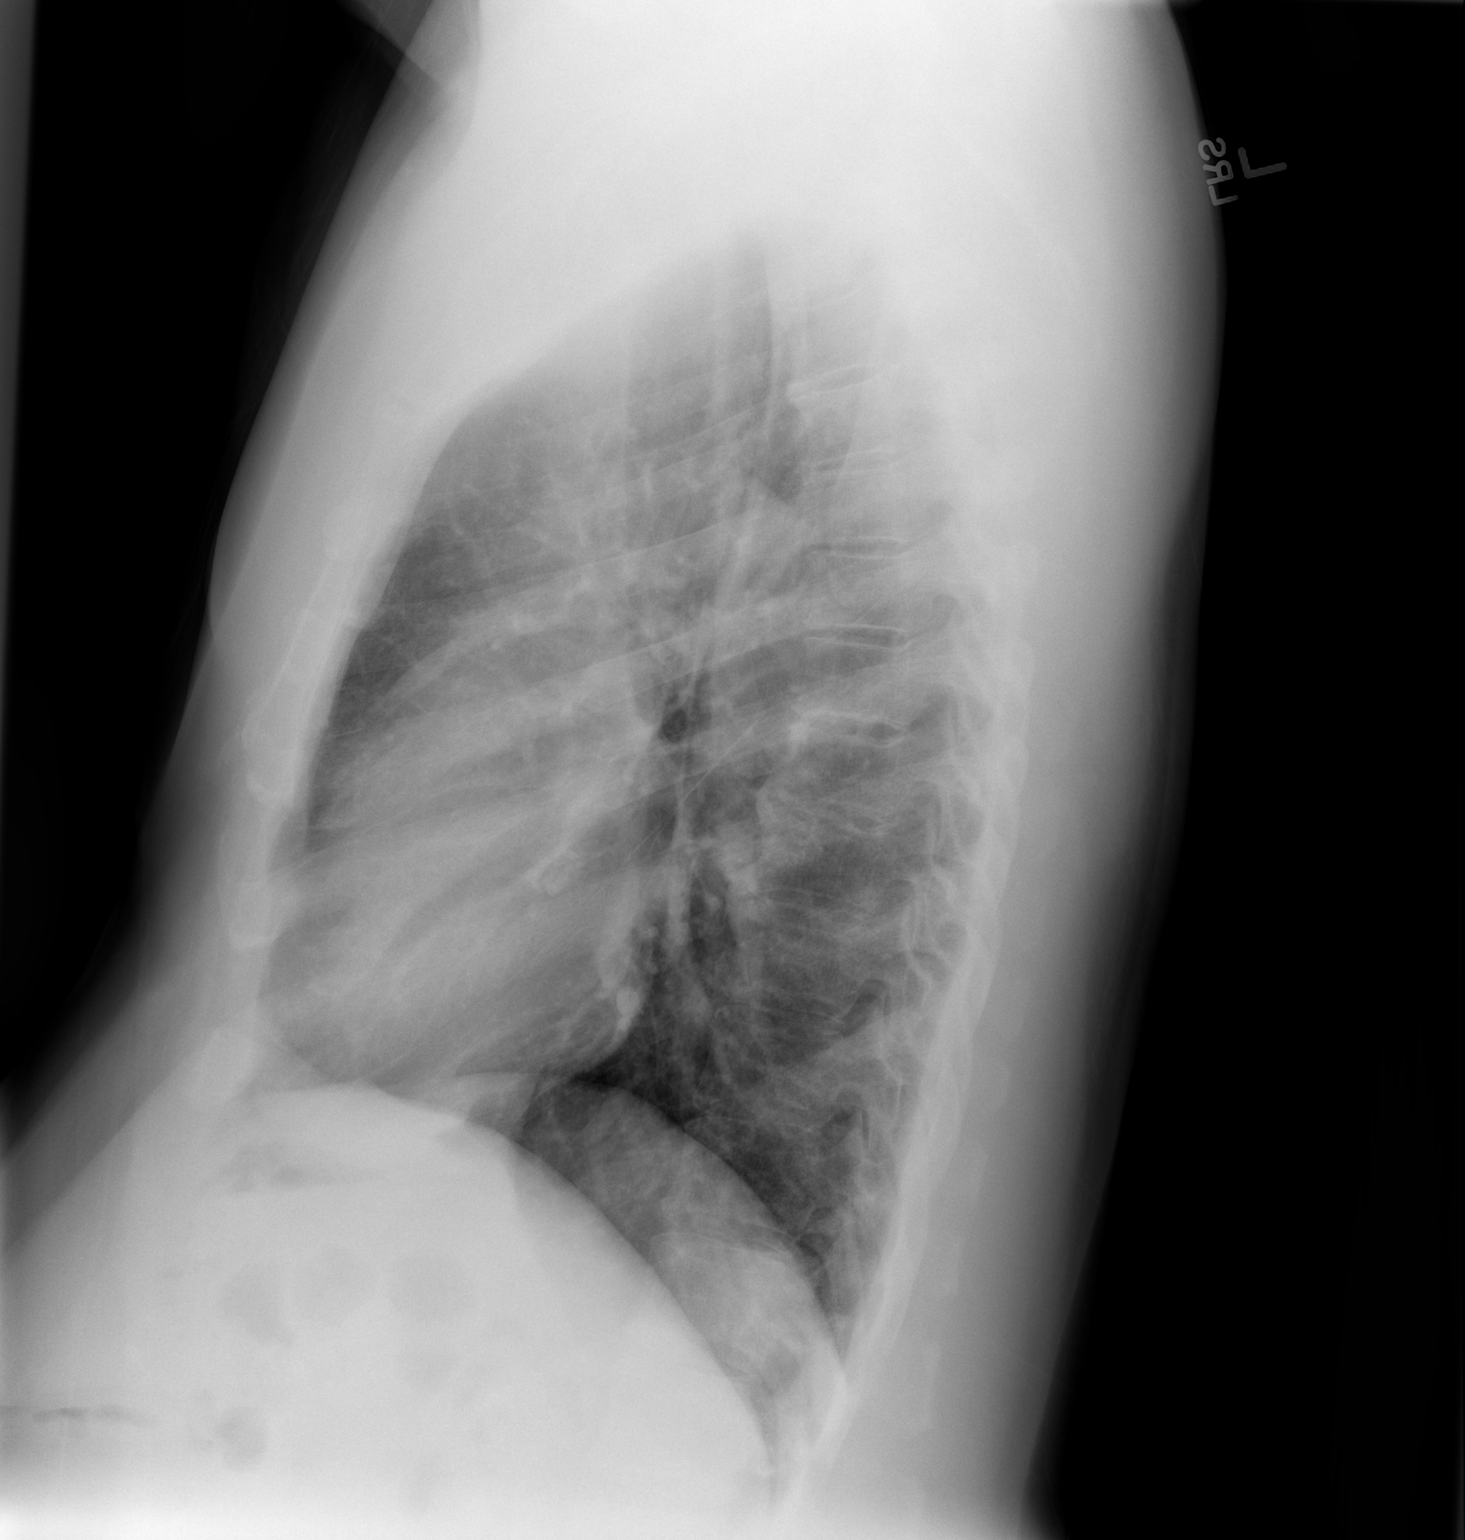

[2 of 2 positions shown; findings below may reference images not displayed]

FINDINGS: Heart and mediastinum are within normal limits. Lungs are clear
bilaterally. There may be mild stable scarring at the right lung
apex. No acute bone abnormality.
IMPRESSION: No active cardiopulmonary disease.

## 2022-04-17 LAB — AMB RESULTS CONSOLE CBG: Glucose: 183
